# Patient Record
Sex: Male | Born: 1960 | Race: Black or African American | Hispanic: No | Smoking: Never smoker
Health system: Southern US, Community
[De-identification: ages and names within clinical notes are randomized; demographics above are authoritative.]

## PROBLEM LIST (undated history)

## (undated) DIAGNOSIS — M1990 Unspecified osteoarthritis, unspecified site: Secondary | ICD-10-CM

## (undated) DIAGNOSIS — E119 Type 2 diabetes mellitus without complications: Secondary | ICD-10-CM

## (undated) HISTORY — PX: BYPASS GRAFT: SHX909

---

## 2020-05-04 ENCOUNTER — Other Ambulatory Visit: Payer: Self-pay

## 2020-05-04 ENCOUNTER — Emergency Department (HOSPITAL_BASED_OUTPATIENT_CLINIC_OR_DEPARTMENT_OTHER): Payer: No Typology Code available for payment source

## 2020-05-04 ENCOUNTER — Emergency Department (HOSPITAL_BASED_OUTPATIENT_CLINIC_OR_DEPARTMENT_OTHER)
Admission: EM | Admit: 2020-05-04 | Discharge: 2020-05-04 | Disposition: A | Discharge status: Home / Self Care | Payer: No Typology Code available for payment source | Attending: Emergency Medicine | Admitting: Emergency Medicine

## 2020-05-04 ENCOUNTER — Encounter (HOSPITAL_BASED_OUTPATIENT_CLINIC_OR_DEPARTMENT_OTHER): Payer: Self-pay

## 2020-05-04 DIAGNOSIS — E119 Type 2 diabetes mellitus without complications: Secondary | ICD-10-CM | POA: Diagnosis not present

## 2020-05-04 DIAGNOSIS — W01198A Fall on same level from slipping, tripping and stumbling with subsequent striking against other object, initial encounter: Secondary | ICD-10-CM | POA: Insufficient documentation

## 2020-05-04 DIAGNOSIS — Y990 Civilian activity done for income or pay: Secondary | ICD-10-CM | POA: Insufficient documentation

## 2020-05-04 DIAGNOSIS — M7989 Other specified soft tissue disorders: Secondary | ICD-10-CM | POA: Insufficient documentation

## 2020-05-04 DIAGNOSIS — S0990XA Unspecified injury of head, initial encounter: Secondary | ICD-10-CM | POA: Insufficient documentation

## 2020-05-04 HISTORY — DX: Type 2 diabetes mellitus without complications: E11.9

## 2020-05-04 HISTORY — DX: Unspecified osteoarthritis, unspecified site: M19.90

## 2020-05-04 NOTE — ED Notes (Signed)
Assumed care of this patient. Vitals taken. NAD. Pt reports hitting head on motor. Denies LOC. A&Ox4. Connected to BP, and pulse Ox. Stretcher low, wheels locked, call bell within reach. Will continue to monitor.

## 2020-05-04 NOTE — Discharge Instructions (Signed)
As we discussed, your CT scan shows an area of questionable bleeding but this is felt to be artifact.  The neurosurgeon does not think this is anything to worry about.  You do not need to stop your Xarelto.  Follow-up with your doctor.  Return to the ED if worsening headache, vomiting, unilateral weakness, numbness, tingling, difficulty speaking, difficulty swallowing or any other concerns.

## 2020-05-04 NOTE — ED Triage Notes (Signed)
Pt states on 2/11 at work, he tripped over a pallet and hit his head on a motor. No LOC.

## 2020-05-04 NOTE — ED Provider Notes (Signed)
[facility] EMERGENCY DEPARTMENT Provider Note   CSN: [id number] Arrival date & time: 05/04/20  0241     History Chief Complaint  Patient presents with  . Head Injury    Adam Harding is a 60 y.o. male.  Patient here with head injury.  States he tripped and fell while he was at work of the evening of February 11 at 7 PM.  He tripped over a pallet and hit his head on a metal piece of equipment.  Not lose consciousness.  Has nausea but no vomiting.  Complains of pain over his right eye.  He does take Xarelto for history of coronary bypass.  He also takes chronic prednisone for history of rheumatoid arthritis.  Also has diabetes and osteoarthritis.  Denies any visual change.  Denies any focal weakness, numbness or tingling.  Denies any difficulty breathing or difficulty swallowing.  No vomiting.  No visual change.  The history is provided by the patient.  Head Injury Associated symptoms: headache and nausea   Associated symptoms: no vomiting        Past Medical History:  Diagnosis Date  . Arthritis   . Diabetes mellitus without complication (HCC)   . Osteoarthritis     There are no problems to display for this patient.   Past Surgical History:  Procedure Laterality Date  . BYPASS GRAFT         No family history on file.  Social History   Tobacco Use  . Smoking status: Never Smoker  . Smokeless tobacco: Never Used  Substance Use Topics  . Alcohol use: Yes  . Drug use: Yes    Types: Marijuana    Home Medications Prior to Admission medications   Not on File    Allergies    Patient has no known allergies.  Review of Systems   Review of Systems  Constitutional: Negative for activity change, appetite change, fatigue and fever.  HENT: Negative for congestion and rhinorrhea.   Eyes: Negative for visual disturbance.  Respiratory: Negative for cough, chest tightness and shortness of breath.   Cardiovascular: Negative for chest pain.   Gastrointestinal: Positive for nausea. Negative for abdominal pain and vomiting.  Genitourinary: Negative for dysuria and hematuria.  Musculoskeletal: Negative for arthralgias and myalgias.  Skin: Negative for rash.  Neurological: Positive for headaches. Negative for weakness and light-headedness.   all other systems are negative except as noted in the HPI and PMH.    Physical Exam Updated Vital Signs BP 127/73   Pulse 61   Temp 97.7 F (36.5 C) (Oral)   Resp 17   Ht 5\' 7"  (1.702 m)   Wt 75.8 kg   SpO2 99%   BMI 26.16 kg/m   Physical Exam Vitals and nursing note reviewed.  Constitutional:      General: He is not in acute distress.    Appearance: He is well-developed and well-nourished.  HENT:     Head: Normocephalic.     Comments: Tenderness palpation above right eyebrow.  Extraocular movements are intact    Mouth/Throat:     Mouth: Oropharynx is clear and moist.     Pharynx: No oropharyngeal exudate.  Eyes:     Extraocular Movements: EOM normal.     Conjunctiva/sclera: Conjunctivae normal.     Pupils: Pupils are equal, round, and reactive to light.  Neck:     Comments: No C-spine tenderness Cardiovascular:     Rate and Rhythm: Normal rate and regular rhythm.  Pulses: Intact distal pulses.     Heart sounds: Normal heart sounds. No murmur heard.   Pulmonary:     Effort: Pulmonary effort is normal. No respiratory distress.     Breath sounds: Normal breath sounds.  Abdominal:     Palpations: Abdomen is soft.     Tenderness: There is no abdominal tenderness. There is no guarding or rebound.  Musculoskeletal:        General: Swelling present. No tenderness or edema. Normal range of motion.     Cervical back: Normal range of motion and neck supple.     Comments: Nontender swelling to left dorsal hand which patient states is chronic  Skin:    General: Skin is warm.  Neurological:     Mental Status: He is alert and oriented to person, place, and time.      Cranial Nerves: No cranial nerve deficit.     Motor: No abnormal muscle tone.     Coordination: Coordination normal.     Comments: No ataxia on finger to nose bilaterally. No pronator drift. 5/5 strength throughout. CN 2-12 intact.Equal grip strength. Sensation intact.   Psychiatric:        Mood and Affect: Mood and affect normal.        Behavior: Behavior normal.     ED Results / Procedures / Treatments   Labs (all labs ordered are listed, but only abnormal results are displayed) Labs Reviewed - No data to display  EKG None  Radiology DG Wrist Complete Left  Result Date: 05/04/2020 CLINICAL DATA:  60 year old male status post fall with pain and swelling. EXAM: LEFT WRIST - COMPLETE 3+ VIEW COMPARISON:  Left hand series today. FINDINGS: Calcified peripheral vascular disease and degenerative changes at the left wrist again noted. Underlying normal bone mineralization. Carpal bone alignment maintained. Scaphoid appears intact. No acute fracture or dislocation. IMPRESSION: 1. No acute fracture or dislocation identified. 2. Calcified peripheral vascular disease and chronic joint degeneration at the left wrist. Electronically Signed   By: Odessa Fleming M.D.   On: 05/04/2020 05:57   CT Head Wo Contrast  Result Date: 05/04/2020 CLINICAL DATA:  Poly trauma, tripped over palate and hit head on motor. EXAM: CT HEAD WITHOUT CONTRAST CT CERVICAL SPINE WITHOUT CONTRAST TECHNIQUE: Multidetector CT imaging of the head and cervical spine was performed following the standard protocol without intravenous contrast. Multiplanar CT image reconstructions of the cervical spine were also generated. COMPARISON:  None. FINDINGS: CT HEAD FINDINGS Brain: Suggestion of prior left basal ganglia and right occipital lobe infarction. Patchy and confluent areas of decreased attenuation are noted throughout the deep and periventricular white matter of the cerebral hemispheres bilaterally, compatible with chronic microvascular  ischemic disease. No evidence of large-territorial acute infarction. No parenchymal hemorrhage. No mass lesion. No extra-axial collection. Nonspecific punctate hyperdensity within the left frontal lobe (2:14). No mass effect or midline shift. No hydrocephalus. Basilar cisterns are patent. Vascular: No hyperdense vessel. Skull: No acute fracture or focal lesion. Sinuses/Orbits: Paranasal sinuses and mastoid air cells are clear. The orbits are unremarkable. Other: None. CT CERVICAL SPINE FINDINGS Alignment: Straightening of the normal cervical lordosis likely due to positioning and degenerative changes. Skull base and vertebrae: Multilevel degenerative changes of the spine that are most prominent at the C5 through T1 levels. No acute fracture. No aggressive appearing focal osseous lesion or focal pathologic process. Soft tissues and spinal canal: Ligamentum flavum calcifications. No prevertebral fluid or swelling. No visible canal hematoma. Disc levels:  Multilevel intervertebral  disc space narrowing. Upper chest: Biapical paraseptal emphysematous changes. Other: None. IMPRESSION: 1. Nonspecific punctate hyperdensity within the left frontal lobe. A tiny developing hemorrhage cannot be excluded in the setting of no prior comparisons. 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 2. No acute displaced fracture or traumatic listhesis of the cervical spine. 3.  Emphysema (ICD10-J43.9). Electronically Signed   By: Tish Frederickson M.D.   On: 05/04/2020 05:05   CT Cervical Spine Wo Contrast  Result Date: 05/04/2020 CLINICAL DATA:  Poly trauma, tripped over palate and hit head on motor. EXAM: CT HEAD WITHOUT CONTRAST CT CERVICAL SPINE WITHOUT CONTRAST TECHNIQUE: Multidetector CT imaging of the head and cervical spine was performed following the standard protocol without intravenous contrast. Multiplanar CT image reconstructions of the cervical  spine were also generated. COMPARISON:  None. FINDINGS: CT HEAD FINDINGS Brain: Suggestion of prior left basal ganglia and right occipital lobe infarction. Patchy and confluent areas of decreased attenuation are noted throughout the deep and periventricular white matter of the cerebral hemispheres bilaterally, compatible with chronic microvascular ischemic disease. No evidence of large-territorial acute infarction. No parenchymal hemorrhage. No mass lesion. No extra-axial collection. Nonspecific punctate hyperdensity within the left frontal lobe (2:14). No mass effect or midline shift. No hydrocephalus. Basilar cisterns are patent. Vascular: No hyperdense vessel. Skull: No acute fracture or focal lesion. Sinuses/Orbits: Paranasal sinuses and mastoid air cells are clear. The orbits are unremarkable. Other: None. CT CERVICAL SPINE FINDINGS Alignment: Straightening of the normal cervical lordosis likely due to positioning and degenerative changes. Skull base and vertebrae: Multilevel degenerative changes of the spine that are most prominent at the C5 through T1 levels. No acute fracture. No aggressive appearing focal osseous lesion or focal pathologic process. Soft tissues and spinal canal: Ligamentum flavum calcifications. No prevertebral fluid or swelling. No visible canal hematoma. Disc levels:  Multilevel intervertebral disc space narrowing. Upper chest: Biapical paraseptal emphysematous changes. Other: None. IMPRESSION: 1. Nonspecific punctate hyperdensity within the left frontal lobe. A tiny developing hemorrhage cannot be excluded in the setting of no prior comparisons. 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 2. No acute displaced fracture or traumatic listhesis of the cervical spine. 3.  Emphysema (ICD10-J43.9). Electronically Signed   By: Tish Frederickson M.D.   On: 05/04/2020 05:05   DG Hand Complete Left  Result Date:  05/04/2020 CLINICAL DATA:  60 year old male status post fall with pain and swelling. EXAM: LEFT HAND - COMPLETE 3+ VIEW COMPARISON:  None. FINDINGS: Calcified peripheral vascular disease and radiocarpal osteoarthritis with joint space loss at the left wrist. Distal radius and ulna appear intact. Carpal bone alignment maintained. No carpal or metacarpal fracture identified. Joint space loss also at the 1st and 2nd MCP joints with subchondral sclerosis. First IP osteoarthritis also. Phalanges appear intact. No acute osseous abnormality identified. No discrete soft tissue injury. IMPRESSION: 1.  No acute fracture or dislocation identified about the left hand. 2. Degenerative changes and calcified peripheral vascular disease. Electronically Signed   By: Odessa Fleming M.D.   On: 05/04/2020 05:35    Procedures Procedures   Medications Ordered in ED Medications - No data to display  ED Course  I have reviewed the triage vital signs and the nursing notes.  Pertinent labs & imaging results that were available during my care of the patient were reviewed by me and considered in my medical decision  making (see chart for details).    MDM Rules/Calculators/A&P                         Head injury with nausea, no loss of consciousness.  Patient is anticoagulated.  His neurological exam is intact.  CT findings d/w PA-C Costella of neurosurgery.  He feels this hyperdensity is not significant and does not represent hemorrhage.  This may be artifact.  Patient does not need to stop his anticoagulation.  He does not need specific follow-up. He should return if he develops worsening headache, vomiting, neurological deficit per PA-C Costella.  Results discussed with patient.  He is tolerating p.o. and ambulatory.  Return to the ED if worsening headache, confusion, vomiting, unilateral weakness, numbness, tingling, difficulty speaking or difficulty swallowing or any other concerns. Final Clinical Impression(s) / ED  Diagnoses Final diagnoses:  Injury of head, initial encounter    Rx / DC Orders ED Discharge Orders    None       Rosalin Buster, Jeannett Senior, MD 05/04/20 [phone]

## 2021-07-25 IMAGING — CT CT CERVICAL SPINE W/O CM
3 of 4 series · 13 of 33 positions shown, 16 images · non-contrast
Comparison: None.

CLINICAL DATA: Poly trauma, tripped over palate and hit head on
motor.

EXAM:
CT HEAD WITHOUT CONTRAST
CT CERVICAL SPINE WITHOUT CONTRAST
TECHNIQUE: Multidetector CT imaging of the head and cervical spine was
performed following the standard protocol without intravenous
contrast. Multiplanar CT image reconstructions of the cervical spine
were also generated.

[Series 3: c_spine 2.0 i30s 3 · axial · 0.36mm/px · z∈[+602,+704]mm · 5 of 77 slices shown, 7 images]
[im 13/77  soft-tissue]
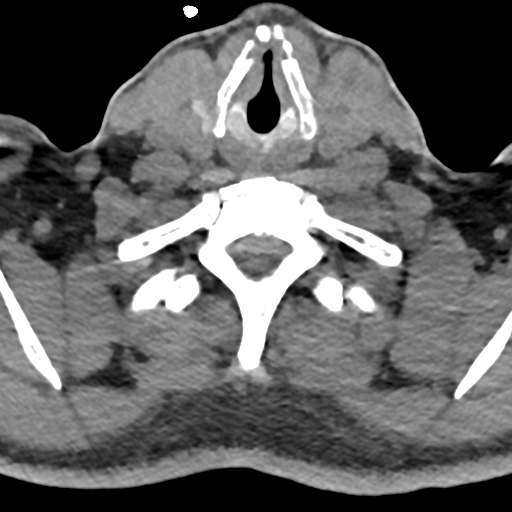
[im 13/77  bone]
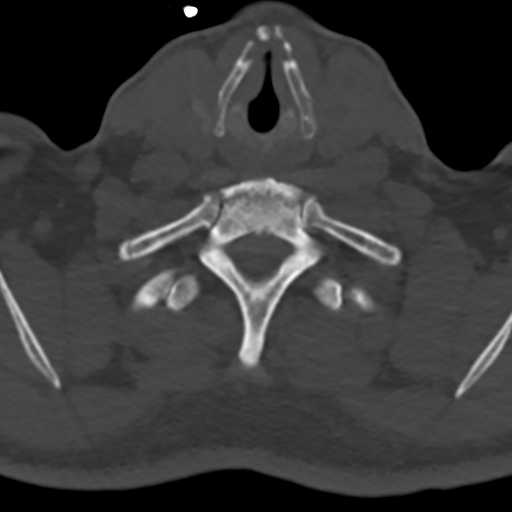
[im 26/77  bone]
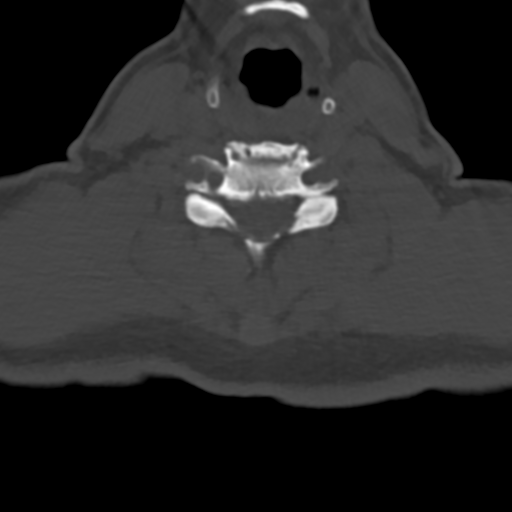
[im 39/77  bone]
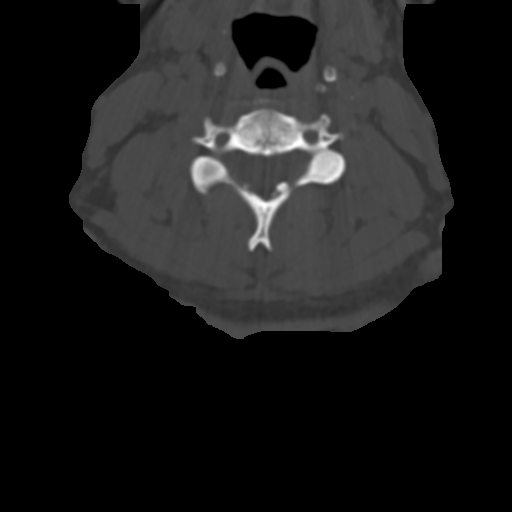
[im 51/77  bone]
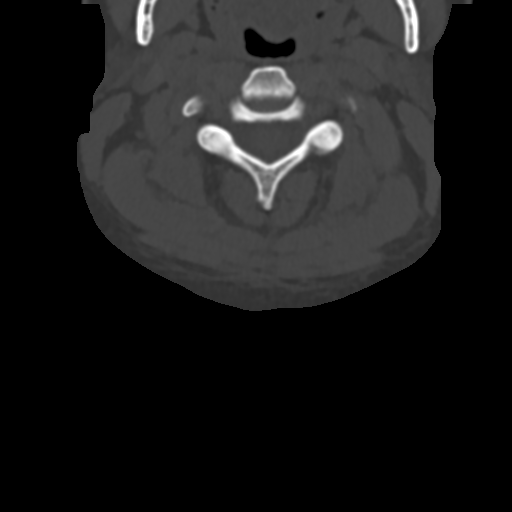
[im 64/77  soft-tissue]
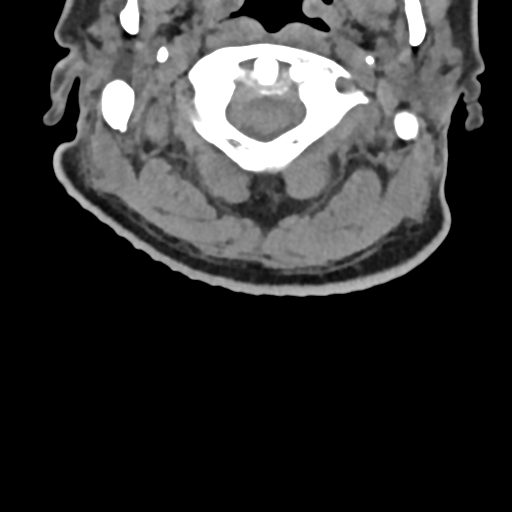
[im 64/77  bone]
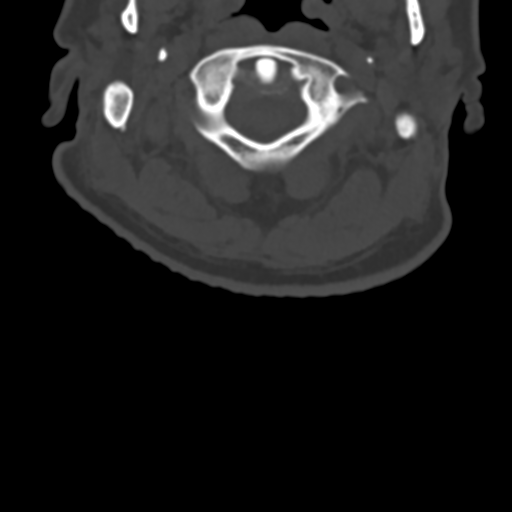

[Series 5: coronals · coronal · 0.23mm/px · 3 of 41 slices shown]
[im 9/41  bone]
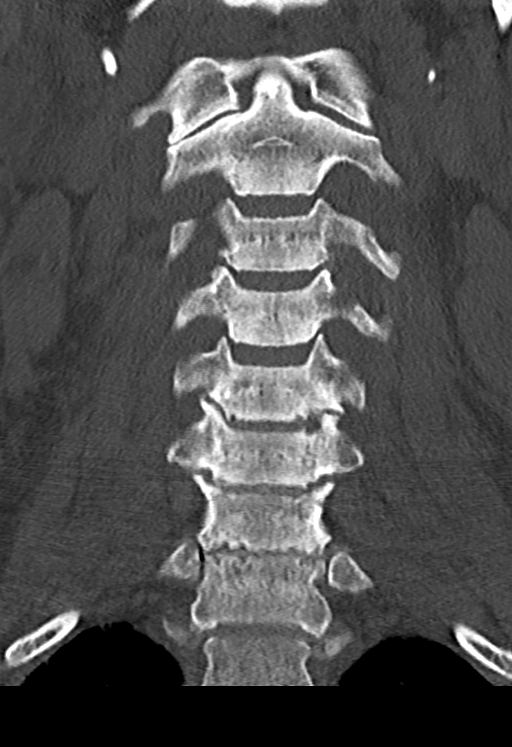
[im 17/41  bone]
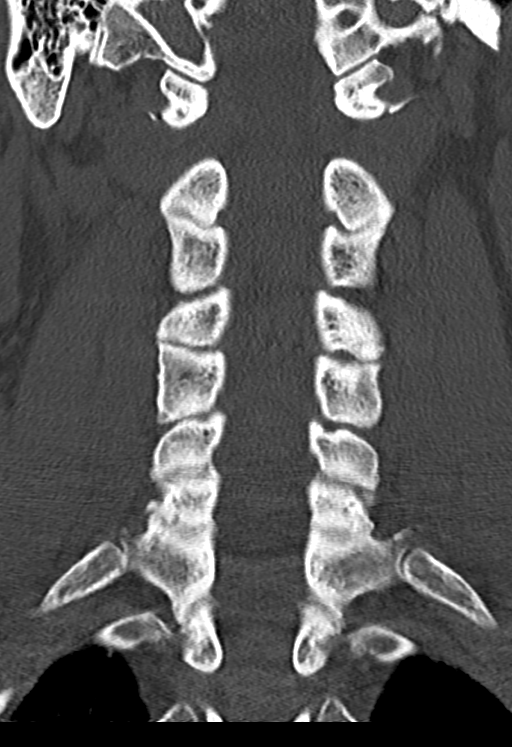
[im 25/41  bone]
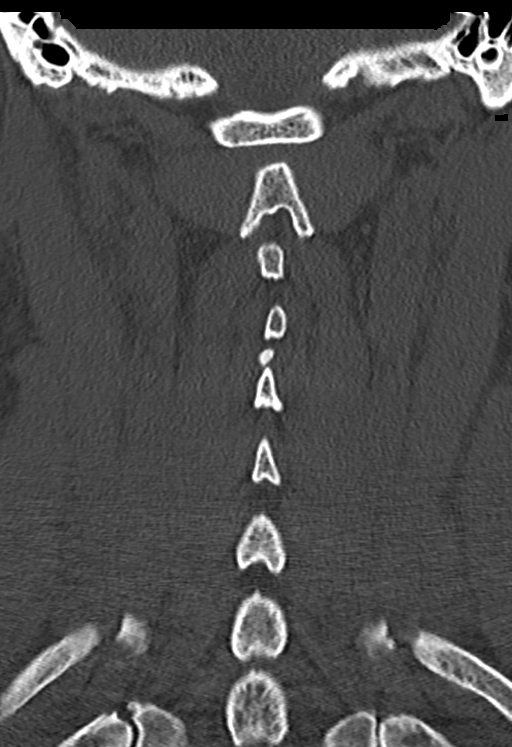

[Series 6: sagittals · sagittal · 0.31mm/px · 5 of 61 slices shown, 6 images]
[im 21/61  bone]
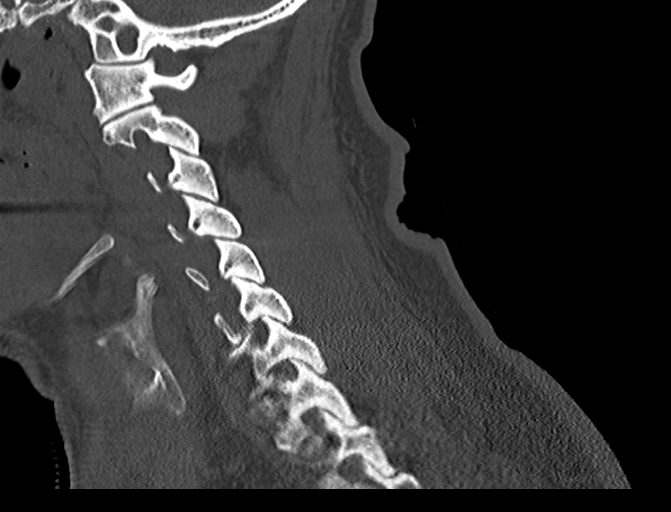
[im 26/61  bone]
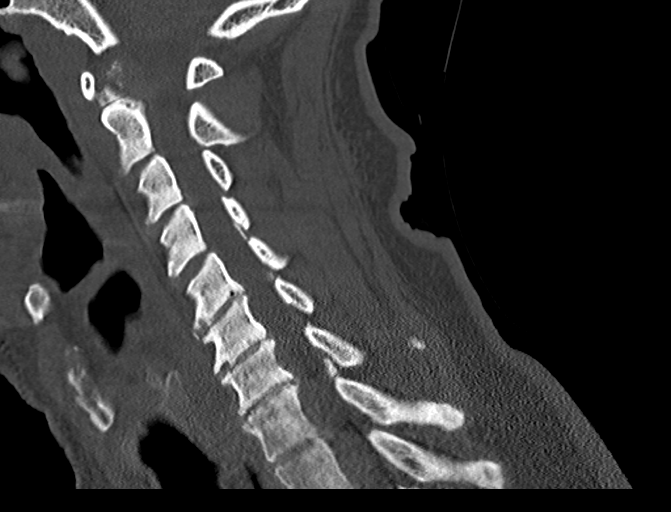
[im 31/61  soft-tissue]
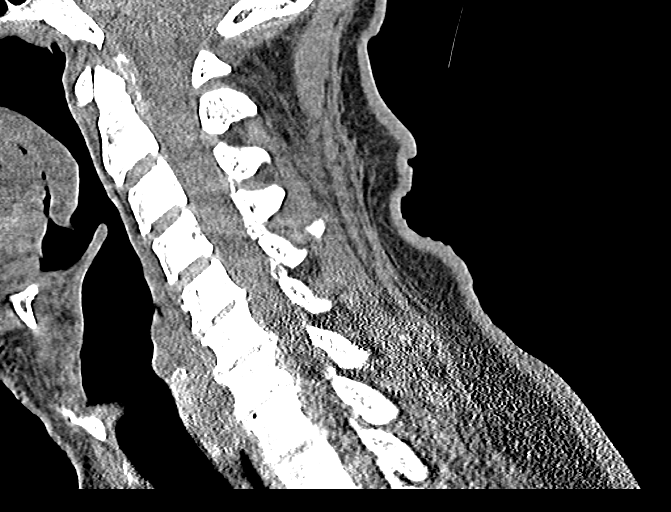
[im 31/61  bone]
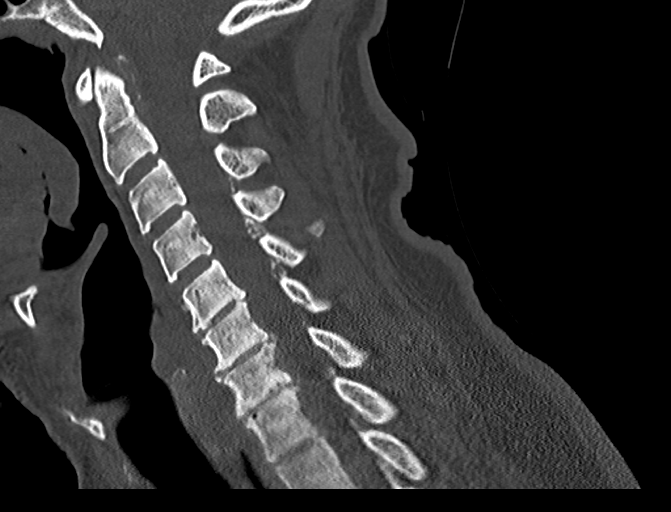
[im 36/61  bone]
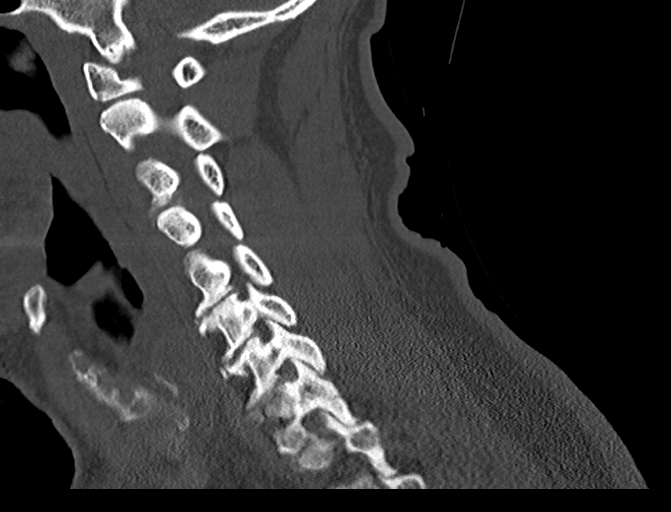
[im 41/61  bone]
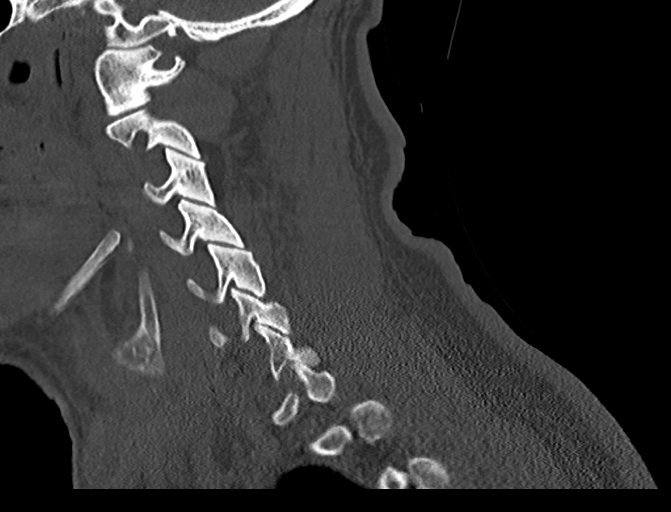

[13 of 33 positions shown; findings below may reference images not displayed]

FINDINGS: CT HEAD FINDINGS

Brain:

Suggestion of prior left basal ganglia and right occipital lobe
infarction. Patchy and confluent areas of decreased attenuation are
noted throughout the deep and periventricular white matter of the
cerebral hemispheres bilaterally, compatible with chronic
microvascular ischemic disease.

No evidence of large-territorial acute infarction. No parenchymal
hemorrhage. No mass lesion. No extra-axial collection.

Nonspecific punctate hyperdensity within the left frontal lobe
([DATE]).

No mass effect or midline shift. No hydrocephalus. Basilar cisterns
are patent.

Vascular: No hyperdense vessel.

Skull: No acute fracture or focal lesion.

Sinuses/Orbits: Paranasal sinuses and mastoid air cells are clear.
The orbits are unremarkable.

Other: None.

CT CERVICAL SPINE FINDINGS

Alignment: Straightening of the normal cervical lordosis likely due
to positioning and degenerative changes.

Skull base and vertebrae: Multilevel degenerative changes of the
spine that are most prominent at the C5 through T1 levels. No acute
fracture. No aggressive appearing focal osseous lesion or focal
pathologic process.

Soft tissues and spinal canal: Ligamentum flavum calcifications. No
prevertebral fluid or swelling. No visible canal hematoma.

Disc levels:  Multilevel intervertebral disc space narrowing.

Upper chest: Biapical paraseptal emphysematous changes.

Other: None.
IMPRESSION: 1. Nonspecific punctate hyperdensity within the left frontal lobe. A
tiny developing hemorrhage cannot be excluded in the setting of no
prior comparisons. Otherwise no acute intracranial abnormality in a
patient with chronic left basal ganglia and right occipital lobe
infarction.
2. No acute displaced fracture or traumatic listhesis of the
cervical spine.
3.  Emphysema ([id number]).

## 2021-07-25 IMAGING — DX DG WRIST COMPLETE 3+V*L*
4 series · 4 of 4 positions shown · non-contrast
Comparison: Left hand series today.

CLINICAL DATA: 59-year-old male status post fall with pain and
swelling.

EXAM:
LEFT WRIST - COMPLETE 3+ VIEW

[wrist ap (1 of 2)]
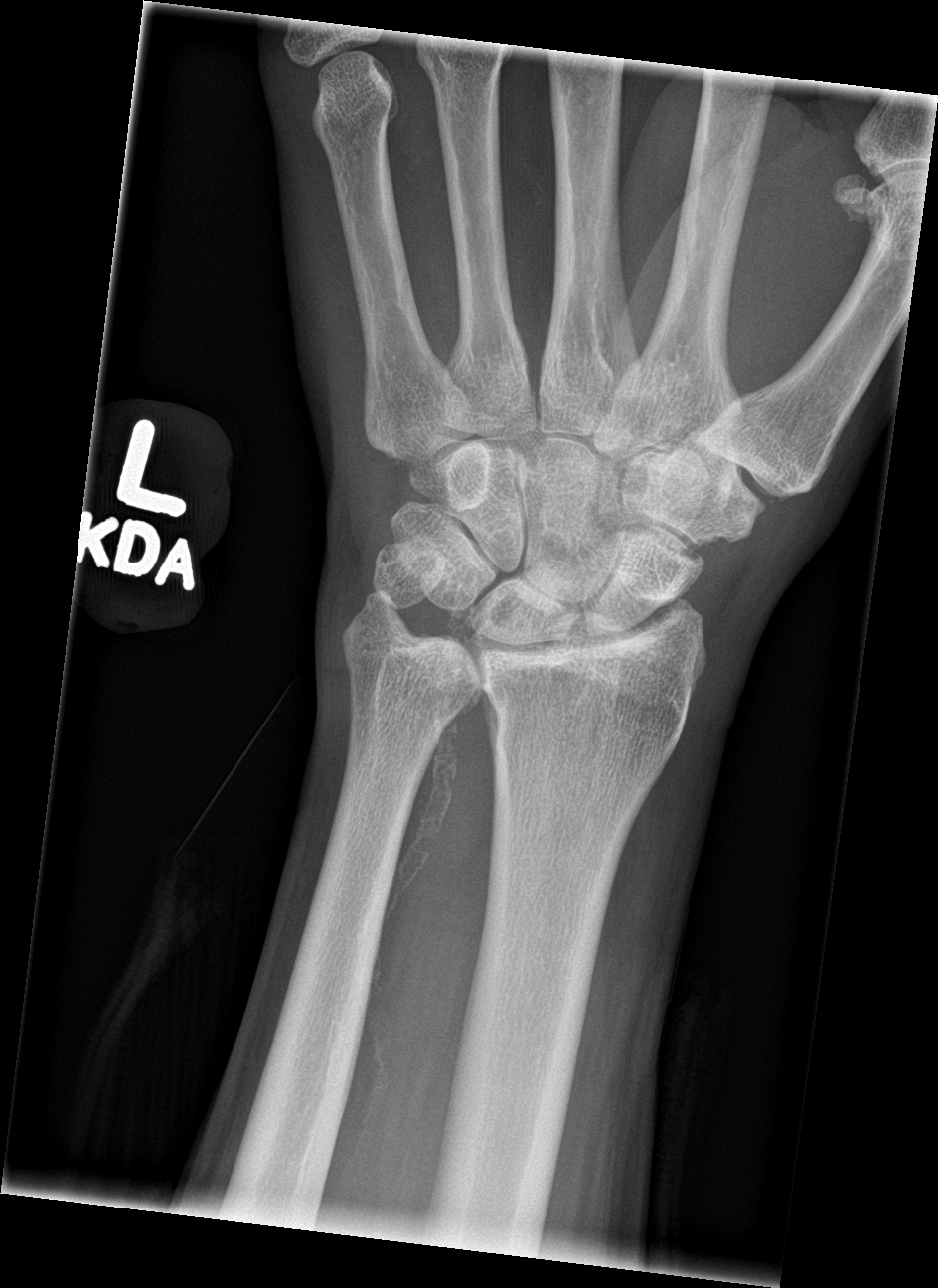

[wrist obl]
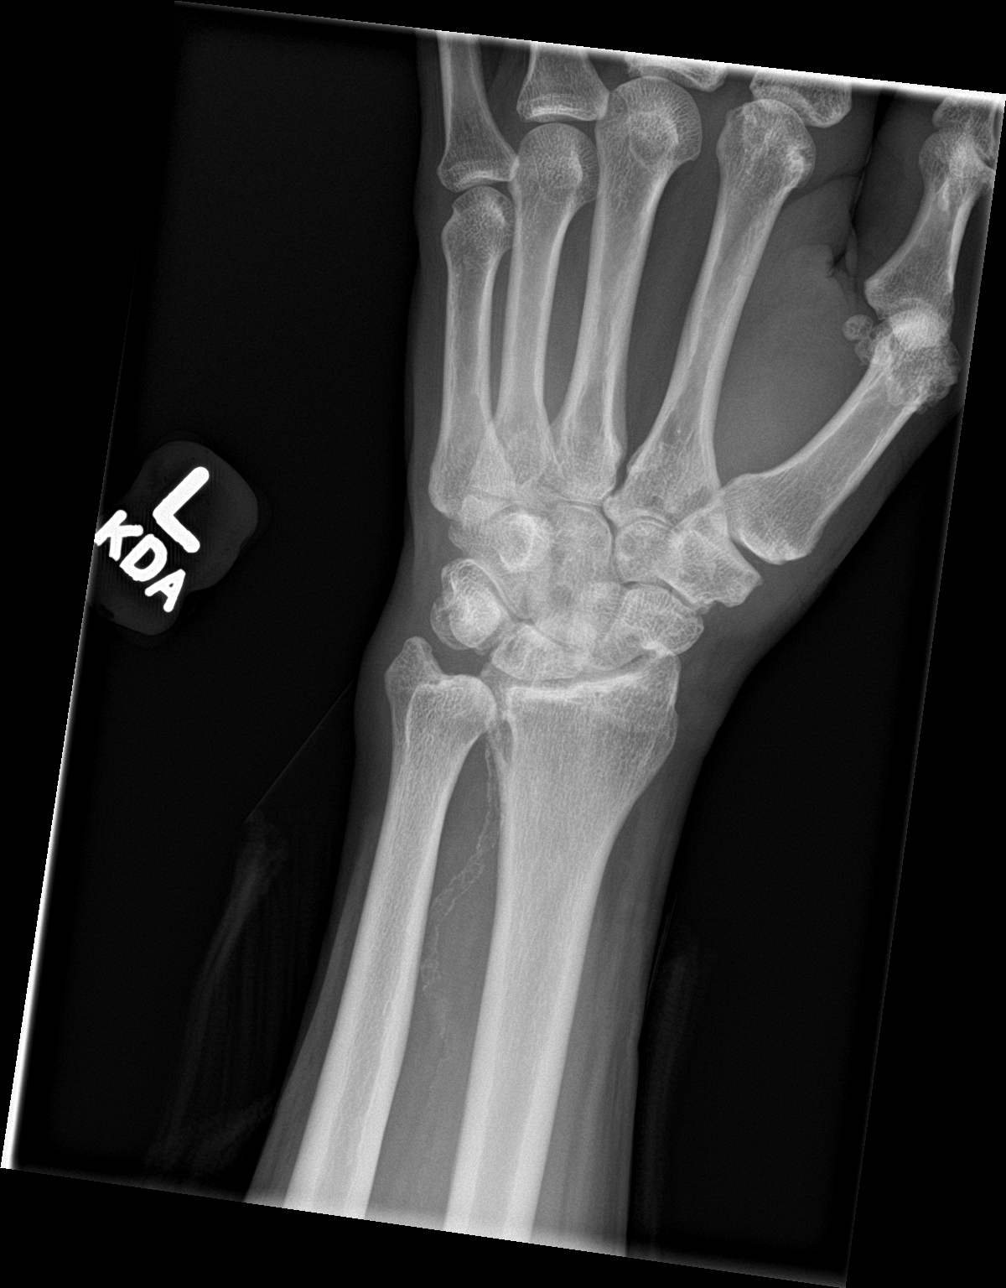

[wrist tunnel]
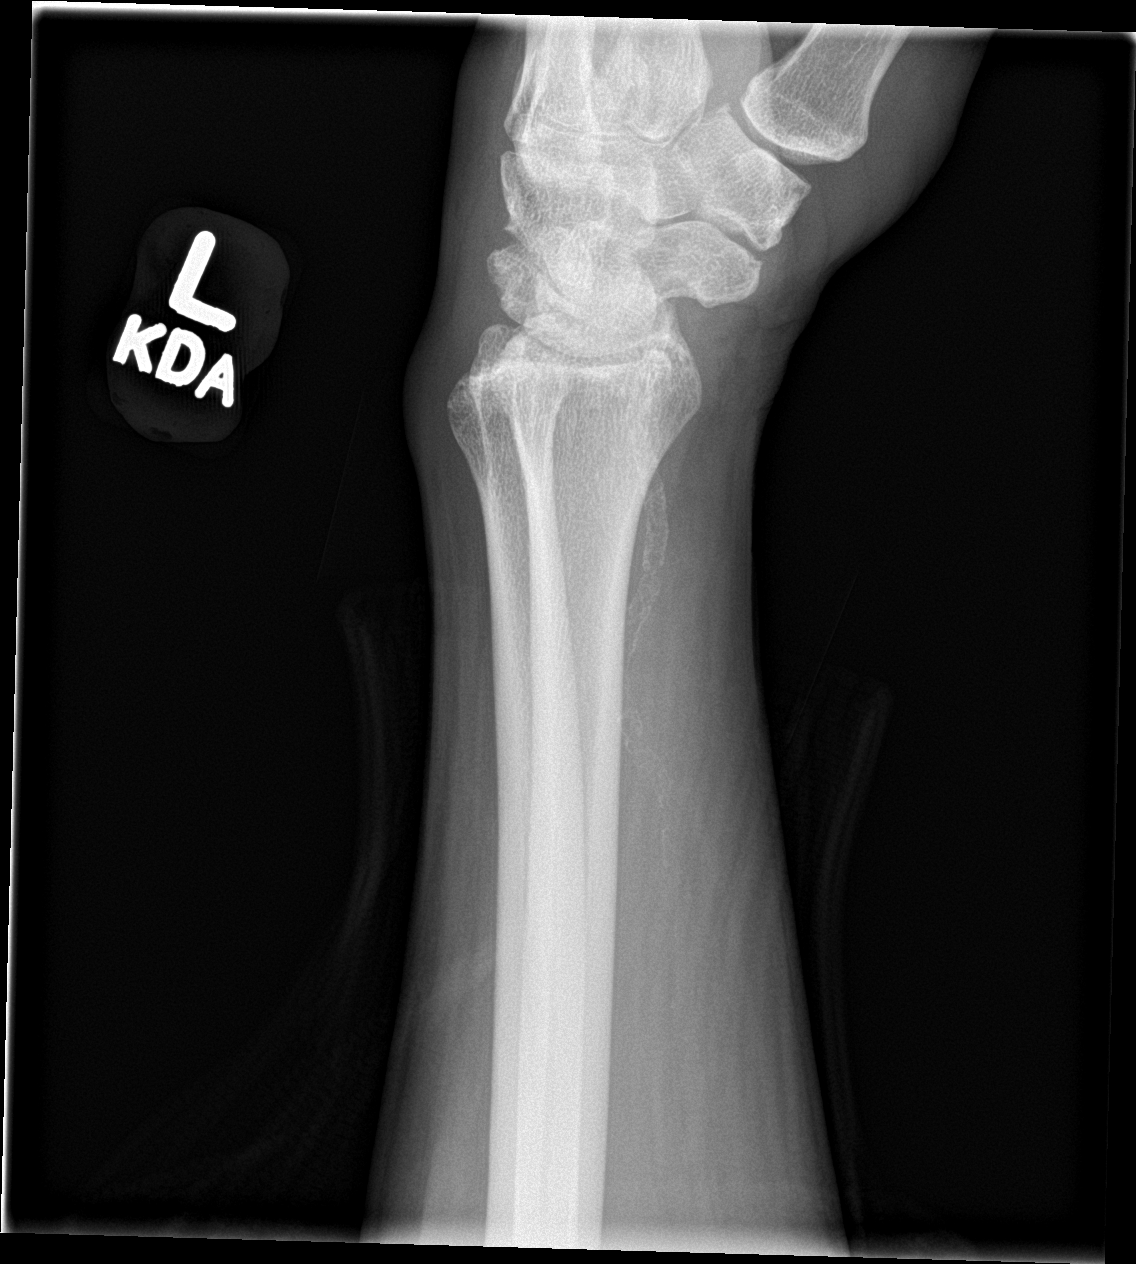

[wrist ap (2 of 2)]
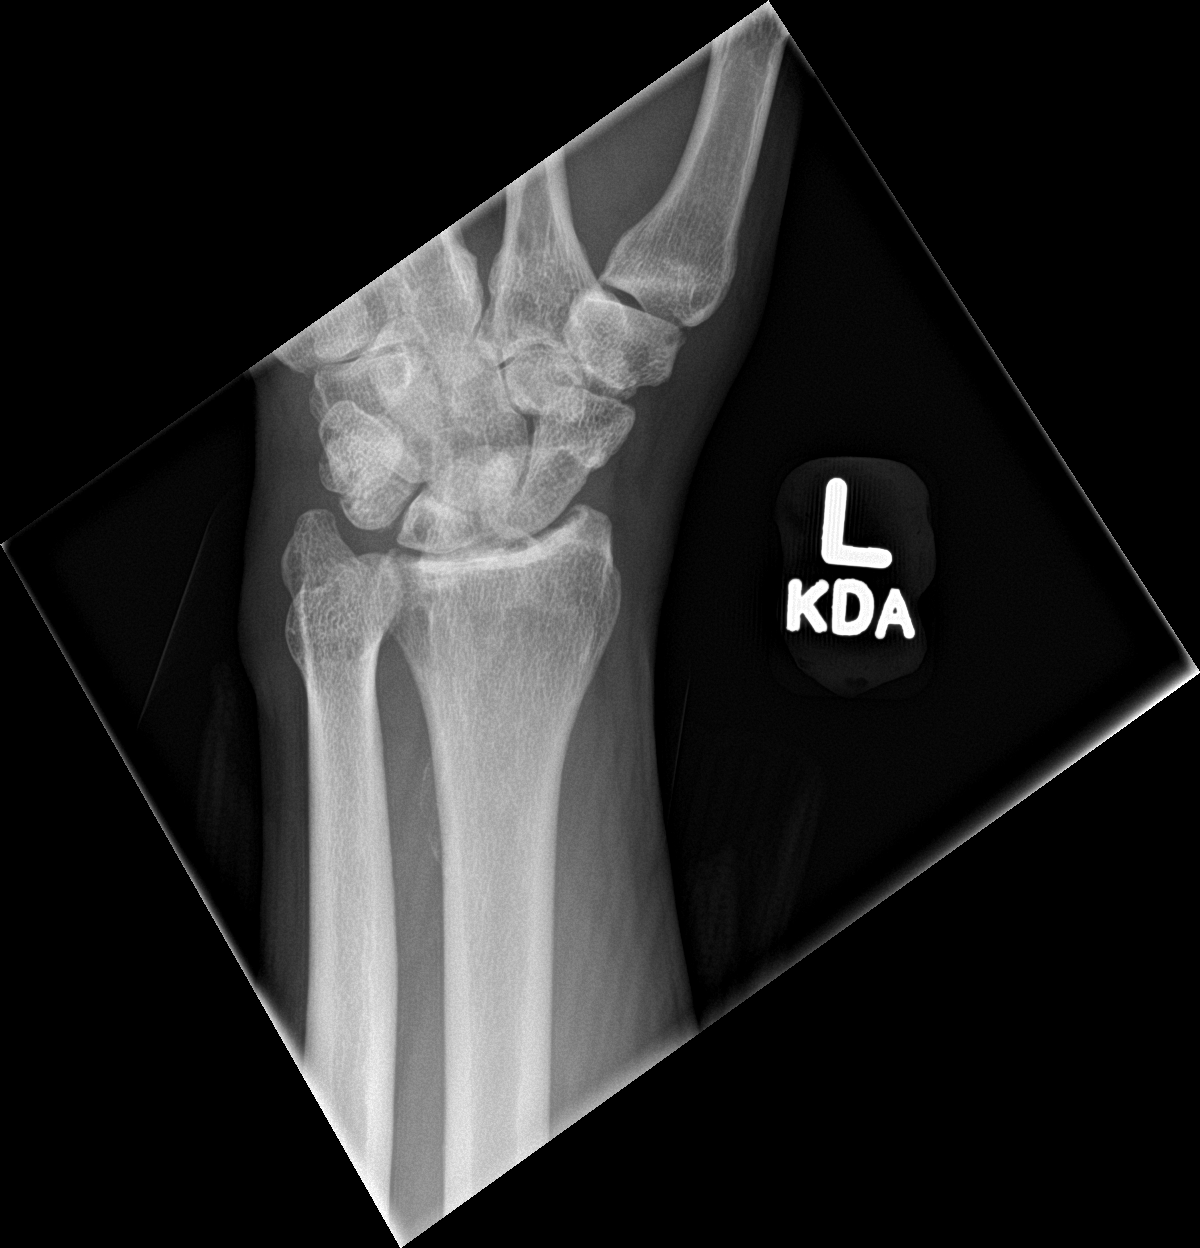

[4 of 4 positions shown; findings below may reference images not displayed]

FINDINGS: Calcified peripheral vascular disease and degenerative changes at
the left wrist again noted. Underlying normal bone mineralization.
Carpal bone alignment maintained. Scaphoid appears intact. No acute
fracture or dislocation.
IMPRESSION: 1. No acute fracture or dislocation identified.
2. Calcified peripheral vascular disease and chronic joint
degeneration at the left wrist.
